# Patient Record
Sex: Male | Born: 1970 | Race: White | Hispanic: No | Marital: Single | Smoking: Never smoker
Health system: Southern US, Community
[De-identification: ages and names within clinical notes are randomized; demographics above are authoritative.]

## PROBLEM LIST (undated history)

## (undated) DIAGNOSIS — T79A29A Traumatic compartment syndrome of unspecified lower extremity, initial encounter: Secondary | ICD-10-CM

## (undated) HISTORY — PX: VASECTOMY: SHX75

## (undated) HISTORY — PX: NO PAST SURGERIES: SHX2092

---

## 1997-12-04 ENCOUNTER — Emergency Department (HOSPITAL_COMMUNITY): Admission: EM | Admit: 1997-12-04 | Discharge: 1997-12-04 | Payer: Self-pay | Admitting: *Deleted

## 1999-09-17 ENCOUNTER — Emergency Department (HOSPITAL_COMMUNITY): Admission: EM | Admit: 1999-09-17 | Discharge: 1999-09-17 | Payer: Self-pay | Admitting: Emergency Medicine

## 1999-09-17 ENCOUNTER — Encounter: Payer: Self-pay | Admitting: Emergency Medicine

## 1999-10-07 ENCOUNTER — Emergency Department (HOSPITAL_COMMUNITY): Admission: EM | Admit: 1999-10-07 | Discharge: 1999-10-08 | Payer: Self-pay | Admitting: Emergency Medicine

## 2000-12-26 ENCOUNTER — Emergency Department (HOSPITAL_COMMUNITY): Admission: EM | Admit: 2000-12-26 | Discharge: 2000-12-26 | Payer: Self-pay | Admitting: *Deleted

## 2001-02-16 ENCOUNTER — Emergency Department (HOSPITAL_COMMUNITY): Admission: EM | Admit: 2001-02-16 | Discharge: 2001-02-16 | Payer: Self-pay | Admitting: Emergency Medicine

## 2005-10-18 ENCOUNTER — Emergency Department (HOSPITAL_COMMUNITY): Admission: EM | Admit: 2005-10-18 | Discharge: 2005-10-18 | Payer: Self-pay | Admitting: Emergency Medicine

## 2005-10-23 ENCOUNTER — Ambulatory Visit (HOSPITAL_BASED_OUTPATIENT_CLINIC_OR_DEPARTMENT_OTHER): Admission: RE | Admit: 2005-10-23 | Discharge: 2005-10-23 | Payer: Self-pay | Admitting: Urology

## 2006-01-05 ENCOUNTER — Emergency Department (HOSPITAL_COMMUNITY): Admission: EM | Admit: 2006-01-05 | Discharge: 2006-01-05 | Payer: Self-pay | Admitting: Emergency Medicine

## 2007-01-26 IMAGING — CR DG ABDOMEN 1V
1 series · 1 of 1 positions shown · non-contrast
Comparison: None.

CLINICAL DATA: Right distal ureteral stone.
 ABDOMEN ? 1 VIEW ? 10/23/05:

[t abdomen supine]
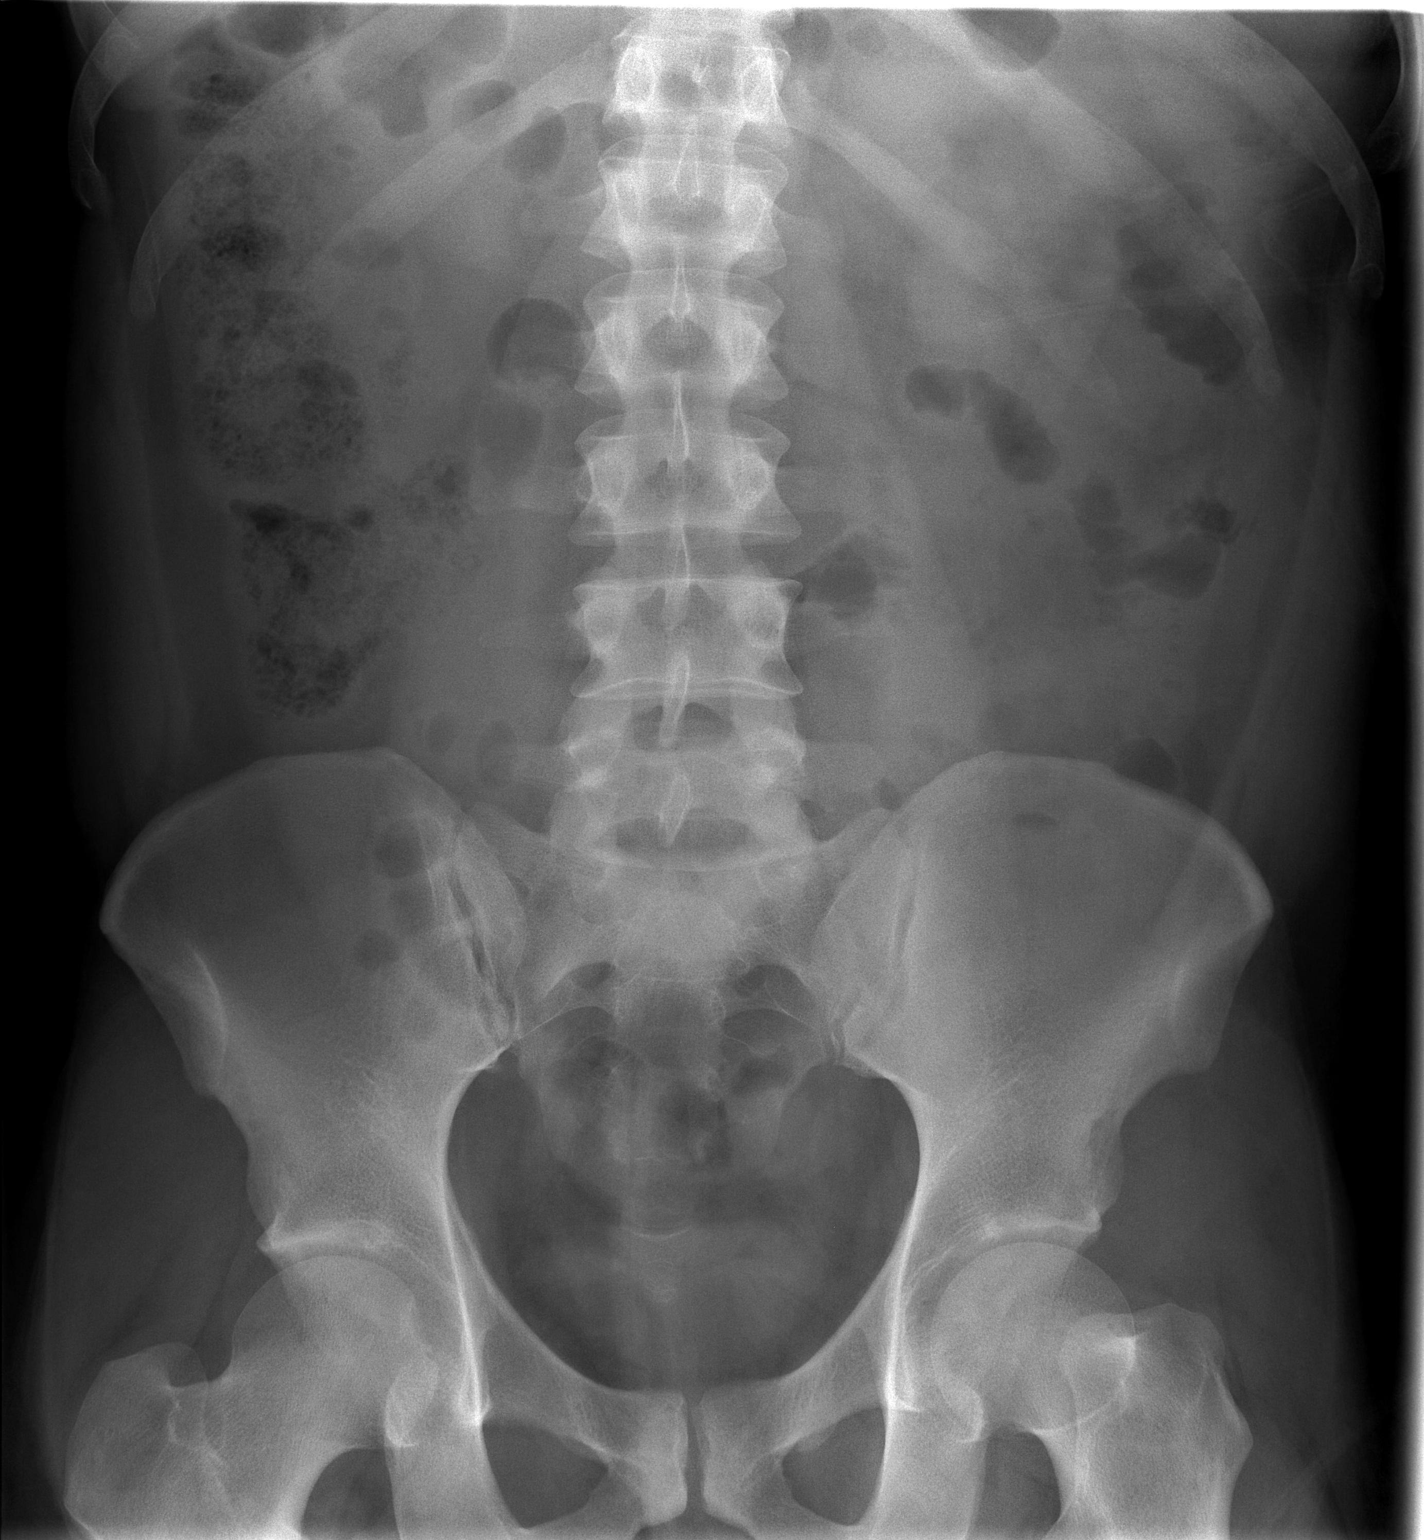

[1 of 1 positions shown; findings below may reference images not displayed]

FINDINGS: The bowel gas pattern is nonspecific.  Bones are unremarkable.  
 A 4-5 mm radiodensity is seen just caudal to the right SI joint which is approximately the location of the ureteral stone seen on the CT scan from 5 days ago.  Although not a definite finding, this may well represent the distal right ureteral stone.
IMPRESSION: 4-5 mm radiopacity just caudal to the right SI joint may represent the distal right ureteral stone.

## 2008-06-04 ENCOUNTER — Encounter: Admission: RE | Admit: 2008-06-04 | Discharge: 2008-06-04 | Payer: Self-pay | Admitting: Internal Medicine

## 2010-11-21 NOTE — Op Note (Signed)
[name], Roger Conway                ACCOUNT NO.:  [id number]   MEDICAL RECORD NO.:  [id number]          PATIENT TYPE:  AMB   LOCATION:  [facility]                         FACILITY:  [facility]   PHYSICIAN:  Bertram Millard. Dahlstedt, M.D.DATE OF BIRTH:  Nov 17, 1970   DATE OF PROCEDURE:  10/23/2005  DATE OF DISCHARGE:                                 OPERATIVE REPORT   PREOPERATIVE DIAGNOSIS:  Right ureteral calculus.   POSTOPERATIVE DIAGNOSES:  Right ureteral calculus.   PROCEDURE:  Right ureteroscopic stone extraction.   SURGEON:  Bertram Millard. Dahlstedt, M.D.   ANESTHESIA:  General.   COMPLICATIONS:  None.   BRIEF HISTORY:  40 year old male who presented earlier this week with a  several day history of right ureteral stone. He has had significant symptoms  and has needed to take narcotics. He has had terrible constipation from the  narcotics. The constipation causes almost as much pain as the stone. The  patient now desires to have treatment of the stone. I feel that ureteroscopy  is the best treatment option in this young gentleman with a distal ureteral  stone. He presents for that procedure.   DESCRIPTION OF PROCEDURE:  The patient was administered preoperative IV  antibiotics and taken to the operating room where general anesthetic was  administered. He was placed in the dorsal lithotomy position. The genitalia  and perineum were prepped and draped. A 6-French panendoscope was advanced  through his urethra and into his bladder. The right ureter was accessed and  the stone was encountered. The basket was used to grasp the stone. It took  several passes of the basket to snare the stone which was then removed.  There was a significant amount of debris behind the stone in that right  ureter. The ureteroscope was then advanced all the way up the ureter as far  as it would go. No other stones were seen. At this point, the scope was  removed, the bladder drained, and the procedure terminated. The  patient was  administered IV Toradol. He tolerated procedure well and was taken to the  PACU in stable condition.      Bertram Millard. Dahlstedt, M.D.  Electronically Signed     SMD/MEDQ  D:  10/23/2005  T:  10/23/2005  Job:  [id number]

## 2012-04-21 ENCOUNTER — Inpatient Hospital Stay (HOSPITAL_COMMUNITY): Payer: 59 | Admitting: Anesthesiology

## 2012-04-21 ENCOUNTER — Encounter (HOSPITAL_COMMUNITY)
Admission: EM | Disposition: A | Discharge status: Home / Self Care | Payer: Self-pay | Source: Ambulatory Visit | Attending: Orthopedic Surgery

## 2012-04-21 ENCOUNTER — Ambulatory Visit (INDEPENDENT_AMBULATORY_CARE_PROVIDER_SITE_OTHER): Payer: 59 | Admitting: Family Medicine

## 2012-04-21 ENCOUNTER — Ambulatory Visit: Payer: 59

## 2012-04-21 ENCOUNTER — Observation Stay (HOSPITAL_COMMUNITY)
Admission: EM | Admit: 2012-04-21 | Discharge: 2012-04-22 | Disposition: A | Discharge status: Home / Self Care | Payer: 59 | Source: Ambulatory Visit | Attending: Orthopedic Surgery | Admitting: Orthopedic Surgery

## 2012-04-21 ENCOUNTER — Encounter (HOSPITAL_COMMUNITY): Payer: Self-pay | Admitting: Anesthesiology

## 2012-04-21 ENCOUNTER — Other Ambulatory Visit: Payer: Self-pay | Admitting: Orthopedic Surgery

## 2012-04-21 ENCOUNTER — Encounter (HOSPITAL_COMMUNITY): Payer: Self-pay | Admitting: *Deleted

## 2012-04-21 ENCOUNTER — Encounter (HOSPITAL_COMMUNITY): Payer: Self-pay | Admitting: Orthopedic Surgery

## 2012-04-21 VITALS — BP 152/90 | HR 70 | Temp 98.0°F | Resp 20 | Ht 65.0 in | Wt 225.0 lb

## 2012-04-21 DIAGNOSIS — M79609 Pain in unspecified limb: Secondary | ICD-10-CM

## 2012-04-21 DIAGNOSIS — M79604 Pain in right leg: Secondary | ICD-10-CM

## 2012-04-21 DIAGNOSIS — Y936A Activity, physical games generally associated with school recess, summer camp and children: Secondary | ICD-10-CM | POA: Insufficient documentation

## 2012-04-21 DIAGNOSIS — Y998 Other external cause status: Secondary | ICD-10-CM | POA: Insufficient documentation

## 2012-04-21 DIAGNOSIS — M25469 Effusion, unspecified knee: Secondary | ICD-10-CM

## 2012-04-21 DIAGNOSIS — M25561 Pain in right knee: Secondary | ICD-10-CM

## 2012-04-21 DIAGNOSIS — M25569 Pain in unspecified knee: Secondary | ICD-10-CM

## 2012-04-21 DIAGNOSIS — T79A29A Traumatic compartment syndrome of unspecified lower extremity, initial encounter: Principal | ICD-10-CM | POA: Diagnosis present

## 2012-04-21 DIAGNOSIS — X58XXXA Exposure to other specified factors, initial encounter: Secondary | ICD-10-CM | POA: Insufficient documentation

## 2012-04-21 DIAGNOSIS — IMO0002 Reserved for concepts with insufficient information to code with codable children: Secondary | ICD-10-CM | POA: Insufficient documentation

## 2012-04-21 DIAGNOSIS — Z01818 Encounter for other preprocedural examination: Secondary | ICD-10-CM

## 2012-04-21 HISTORY — DX: Traumatic compartment syndrome of unspecified lower extremity, initial encounter: T79.A29A

## 2012-04-21 HISTORY — PX: I&D EXTREMITY: SHX5045

## 2012-04-21 LAB — SURGICAL PCR SCREEN: Staphylococcus aureus: NEGATIVE

## 2012-04-21 LAB — BASIC METABOLIC PANEL
BUN: 12 mg/dL (ref 6–23)
Chloride: 104 mEq/L (ref 96–112)
GFR calc Af Amer: >90 mL/min (ref >90)
GFR calc non Af Amer: >90 mL/min (ref >90)
Glucose, Bld: 102 mg/dL — ABNORMAL HIGH (ref 70–99)
Potassium: 3.8 mEq/L (ref 3.5–5.1)
Sodium: 139 mEq/L (ref 135–145)

## 2012-04-21 LAB — POCT UA - MICROSCOPIC ONLY
Bacteria, U Microscopic: NEGATIVE
Casts, Ur, LPF, POC: NEGATIVE
Crystals, Ur, HPF, POC: NEGATIVE
Mucus, UA: POSITIVE
Yeast, UA: NEGATIVE

## 2012-04-21 LAB — POCT URINALYSIS DIPSTICK
Bilirubin, UA: NEGATIVE
Glucose, UA: NEGATIVE
Ketones, UA: NEGATIVE
Leukocytes, UA: NEGATIVE
Nitrite, UA: NEGATIVE
Protein, UA: NEGATIVE
Spec Grav, UA: >=1.03
Urobilinogen, UA: 0.2
pH, UA: 5.5

## 2012-04-21 LAB — CK: Total CK: 231 U/L (ref 7–232)

## 2012-04-21 LAB — CBC
HCT: 38 % — ABNORMAL LOW (ref 39.0–52.0)
Hemoglobin: 13.6 g/dL (ref 13.0–17.0)
RDW: 13.4 % (ref 11.5–15.5)
WBC: 10.1 10*3/uL (ref 4.0–10.5)

## 2012-04-21 SURGERY — IRRIGATION AND DEBRIDEMENT EXTREMITY
Anesthesia: Choice | Site: Leg Upper | Laterality: Right | Wound class: Clean

## 2012-04-21 MED ORDER — DIPHENHYDRAMINE HCL 12.5 MG/5ML PO ELIX: 12.5000 mg | ORAL_SOLUTION | ORAL | Status: DC | PRN

## 2012-04-21 MED ORDER — LACTATED RINGERS IV SOLN
INTRAVENOUS | Status: DC | PRN
  Administered 2012-04-21 (×2): via INTRAVENOUS

## 2012-04-21 MED ORDER — METHOCARBAMOL 500 MG PO TABS: 500.0000 mg | ORAL_TABLET | Freq: Two times a day (BID) | ORAL | Status: DC

## 2012-04-21 MED ORDER — METHOCARBAMOL 500 MG PO TABS
500.0000 mg | ORAL_TABLET | Freq: Four times a day (QID) | ORAL | Status: DC | PRN
  Administered 2012-04-21 – 2012-04-22 (×2): 500 mg via ORAL
  Filled 2012-04-21 (×2): qty 1

## 2012-04-21 MED ORDER — POLYETHYLENE GLYCOL 3350 17 G PO PACK: 17.0000 g | PACK | Freq: Every day | ORAL | Status: DC | PRN

## 2012-04-21 MED ORDER — HYDROMORPHONE HCL PF 1 MG/ML IJ SOLN
INTRAMUSCULAR | Status: DC | PRN
  Administered 2012-04-21 (×2): 0.5 mg via INTRAVENOUS

## 2012-04-21 MED ORDER — DOCUSATE SODIUM 100 MG PO CAPS
100.0000 mg | ORAL_CAPSULE | Freq: Two times a day (BID) | ORAL | Status: DC
  Filled 2012-04-21 (×3): qty 1

## 2012-04-21 MED ORDER — OXYCODONE HCL 5 MG PO TABS: 5.0000 mg | ORAL_TABLET | ORAL | Status: DC | PRN

## 2012-04-21 MED ORDER — HYDROMORPHONE HCL PF 1 MG/ML IJ SOLN
0.5000 mg | INTRAMUSCULAR | Status: DC | PRN
  Administered 2012-04-21: 1 mg via INTRAVENOUS
  Filled 2012-04-21: qty 1

## 2012-04-21 MED ORDER — SENNA 8.6 MG PO TABS
1.0000 | ORAL_TABLET | Freq: Two times a day (BID) | ORAL | Status: DC
  Filled 2012-04-21 (×3): qty 1

## 2012-04-21 MED ORDER — OXYCODONE-ACETAMINOPHEN 5-325 MG PO TABS
1.0000 | ORAL_TABLET | ORAL | Status: DC | PRN
  Administered 2012-04-22 (×2): 2 via ORAL
  Filled 2012-04-21 (×2): qty 2

## 2012-04-21 MED ORDER — CEFAZOLIN SODIUM-DEXTROSE 2-3 GM-% IV SOLR
2.0000 g | INTRAVENOUS | Status: AC
  Administered 2012-04-21: 2 g via INTRAVENOUS
  Filled 2012-04-21: qty 50

## 2012-04-21 MED ORDER — CEFAZOLIN SODIUM 1-5 GM-% IV SOLN
1.0000 g | Freq: Four times a day (QID) | INTRAVENOUS | Status: AC
  Administered 2012-04-21 – 2012-04-22 (×3): 1 g via INTRAVENOUS
  Filled 2012-04-21 (×3): qty 50

## 2012-04-21 MED ORDER — BUPIVACAINE HCL 0.5 % IJ SOLN
INTRAMUSCULAR | Status: DC | PRN
  Administered 2012-04-21: 10 mL

## 2012-04-21 MED ORDER — ONDANSETRON HCL 4 MG PO TABS: 4.0000 mg | ORAL_TABLET | Freq: Four times a day (QID) | ORAL | Status: DC | PRN

## 2012-04-21 MED ORDER — BISACODYL 10 MG RE SUPP: 10.0000 mg | Freq: Every day | RECTAL | Status: DC | PRN

## 2012-04-21 MED ORDER — MUPIROCIN 2 % EX OINT
TOPICAL_OINTMENT | CUTANEOUS | Status: AC
  Administered 2012-04-21: 1
  Filled 2012-04-21: qty 22

## 2012-04-21 MED ORDER — ARTIFICIAL TEARS OP OINT
TOPICAL_OINTMENT | OPHTHALMIC | Status: DC | PRN
  Administered 2012-04-21: 1 via OPHTHALMIC

## 2012-04-21 MED ORDER — EPHEDRINE SULFATE 50 MG/ML IJ SOLN
INTRAMUSCULAR | Status: DC | PRN
  Administered 2012-04-21 (×2): 5 mg via INTRAVENOUS

## 2012-04-21 MED ORDER — ONDANSETRON HCL 4 MG/2ML IJ SOLN: 4.0000 mg | Freq: Four times a day (QID) | INTRAMUSCULAR | Status: DC | PRN

## 2012-04-21 MED ORDER — BUPIVACAINE HCL (PF) 0.5 % IJ SOLN
INTRAMUSCULAR | Status: AC
  Filled 2012-04-21: qty 30

## 2012-04-21 MED ORDER — METHOCARBAMOL 100 MG/ML IJ SOLN
500.0000 mg | Freq: Four times a day (QID) | INTRAVENOUS | Status: DC | PRN
  Filled 2012-04-21: qty 5

## 2012-04-21 MED ORDER — POTASSIUM CHLORIDE IN NACL 20-0.45 MEQ/L-% IV SOLN
INTRAVENOUS | Status: DC
  Administered 2012-04-22: 06:00:00 via INTRAVENOUS
  Filled 2012-04-21 (×3): qty 1000

## 2012-04-21 MED ORDER — MIDAZOLAM HCL 5 MG/5ML IJ SOLN
INTRAMUSCULAR | Status: DC | PRN
  Administered 2012-04-21: 2 mg via INTRAVENOUS

## 2012-04-21 MED ORDER — ZOLPIDEM TARTRATE 5 MG PO TABS: 5.0000 mg | ORAL_TABLET | Freq: Every evening | ORAL | Status: DC | PRN

## 2012-04-21 MED ORDER — PHENYLEPHRINE HCL 10 MG/ML IJ SOLN
INTRAMUSCULAR | Status: DC | PRN
  Administered 2012-04-21: 40 ug via INTRAVENOUS

## 2012-04-21 MED ORDER — METOCLOPRAMIDE HCL 5 MG/ML IJ SOLN: 5.0000 mg | Freq: Three times a day (TID) | INTRAMUSCULAR | Status: DC | PRN

## 2012-04-21 MED ORDER — LIDOCAINE HCL (CARDIAC) 20 MG/ML IV SOLN
INTRAVENOUS | Status: DC | PRN
  Administered 2012-04-21: 50 mg via INTRAVENOUS

## 2012-04-21 MED ORDER — PROPOFOL 10 MG/ML IV BOLUS
INTRAVENOUS | Status: DC | PRN
  Administered 2012-04-21: 200 mg via INTRAVENOUS

## 2012-04-21 MED ORDER — METOCLOPRAMIDE HCL 10 MG PO TABS: 5.0000 mg | ORAL_TABLET | Freq: Three times a day (TID) | ORAL | Status: DC | PRN

## 2012-04-21 MED ORDER — ONDANSETRON HCL 4 MG/2ML IJ SOLN
INTRAMUSCULAR | Status: DC | PRN
  Administered 2012-04-21: 4 mg via INTRAVENOUS

## 2012-04-21 MED ORDER — FENTANYL CITRATE 0.05 MG/ML IJ SOLN
INTRAMUSCULAR | Status: DC | PRN
  Administered 2012-04-21 (×2): 50 ug via INTRAVENOUS
  Administered 2012-04-21: 100 ug via INTRAVENOUS
  Administered 2012-04-21: 50 ug via INTRAVENOUS

## 2012-04-21 MED ORDER — OXYCODONE-ACETAMINOPHEN 10-325 MG PO TABS: 1.0000 | ORAL_TABLET | Freq: Three times a day (TID) | ORAL | Status: DC | PRN

## 2012-04-21 MED ORDER — OXYCODONE-ACETAMINOPHEN 10-325 MG PO TABS: 1.0000 | ORAL_TABLET | Freq: Three times a day (TID) | ORAL | Status: AC | PRN

## 2012-04-21 MED ORDER — SODIUM CHLORIDE 0.9 % IR SOLN
Status: DC | PRN
  Administered 2012-04-21: 3000 mL

## 2012-04-21 SURGICAL SUPPLY — 48 items
BANDAGE ELASTIC 4 VELCRO ST LF (GAUZE/BANDAGES/DRESSINGS) IMPLANT
BANDAGE ELASTIC 6 VELCRO ST LF (GAUZE/BANDAGES/DRESSINGS) ×2 IMPLANT
BANDAGE GAUZE ELAST BULKY 4 IN (GAUZE/BANDAGES/DRESSINGS) IMPLANT
BNDG COHESIVE 4X5 TAN STRL (GAUZE/BANDAGES/DRESSINGS) ×2 IMPLANT
BOOTCOVER CLEANROOM LRG (PROTECTIVE WEAR) IMPLANT
CLOTH BEACON ORANGE TIMEOUT ST (SAFETY) ×2 IMPLANT
COVER SURGICAL LIGHT HANDLE (MISCELLANEOUS) ×2 IMPLANT
CUFF TOURNIQUET SINGLE 34IN LL (TOURNIQUET CUFF) IMPLANT
DRAPE ORTHO SPLIT 77X108 STRL (DRAPES) ×2
DRAPE SURG ORHT 6 SPLT 77X108 (DRAPES) ×2 IMPLANT
DRAPE U-SHAPE 47X51 STRL (DRAPES) ×2 IMPLANT
DRSG MEPILEX BORDER 4X8 (GAUZE/BANDAGES/DRESSINGS) ×4 IMPLANT
DURAPREP 26ML APPLICATOR (WOUND CARE) ×2 IMPLANT
ELECT REM PT RETURN 9FT ADLT (ELECTROSURGICAL) ×2
ELECTRODE REM PT RTRN 9FT ADLT (ELECTROSURGICAL) ×1 IMPLANT
EVACUATOR 1/8 PVC DRAIN (DRAIN) IMPLANT
GAUZE XEROFORM 1X8 LF (GAUZE/BANDAGES/DRESSINGS) IMPLANT
GLOVE BIO SURGEON STRL SZ7 (GLOVE) ×2 IMPLANT
GLOVE BIOGEL PI IND STRL 7.0 (GLOVE) ×1 IMPLANT
GLOVE BIOGEL PI IND STRL 8 (GLOVE) ×1 IMPLANT
GLOVE BIOGEL PI INDICATOR 7.0 (GLOVE) ×1
GLOVE BIOGEL PI INDICATOR 8 (GLOVE) ×1
GLOVE ORTHO TXT STRL SZ7.5 (GLOVE) ×2 IMPLANT
GLOVE SURG ORTHO 8.0 STRL STRW (GLOVE) IMPLANT
GLOVE SURG SS PI 7.0 STRL IVOR (GLOVE) ×2 IMPLANT
GOWN STRL NON-REIN LRG LVL3 (GOWN DISPOSABLE) ×6 IMPLANT
HANDPIECE INTERPULSE COAX TIP (DISPOSABLE) ×2
KIT BASIN OR (CUSTOM PROCEDURE TRAY) ×2 IMPLANT
KIT ROOM TURNOVER OR (KITS) ×2 IMPLANT
MANIFOLD NEPTUNE II (INSTRUMENTS) ×2 IMPLANT
NEEDLE 22X1 1/2 (OR ONLY) (NEEDLE) ×2 IMPLANT
NS IRRIG 1000ML POUR BTL (IV SOLUTION) IMPLANT
PACK ORTHO EXTREMITY (CUSTOM PROCEDURE TRAY) ×2 IMPLANT
PAD ARMBOARD 7.5X6 YLW CONV (MISCELLANEOUS) ×2 IMPLANT
SET HNDPC FAN SPRY TIP SCT (DISPOSABLE) ×1 IMPLANT
SPONGE GAUZE 4X4 12PLY (GAUZE/BANDAGES/DRESSINGS) ×2 IMPLANT
SPONGE LAP 18X18 X RAY DECT (DISPOSABLE) ×2 IMPLANT
STOCKINETTE IMPERVIOUS 9X36 MD (GAUZE/BANDAGES/DRESSINGS) ×2 IMPLANT
SUT ETHILON 2 0 FS 18 (SUTURE) ×4 IMPLANT
SUT ETHILON 3 0 PS 1 (SUTURE) IMPLANT
SYR CONTROL 10ML LL (SYRINGE) ×2 IMPLANT
TOWEL OR 17X24 6PK STRL BLUE (TOWEL DISPOSABLE) ×2 IMPLANT
TOWEL OR 17X26 10 PK STRL BLUE (TOWEL DISPOSABLE) ×2 IMPLANT
TUBE ANAEROBIC SPECIMEN COL (MISCELLANEOUS) IMPLANT
TUBE CONNECTING 12X1/4 (SUCTIONS) ×2 IMPLANT
UNDERPAD 30X30 INCONTINENT (UNDERPADS AND DIAPERS) IMPLANT
WATER STERILE IRR 1000ML POUR (IV SOLUTION) IMPLANT
YANKAUER SUCT BULB TIP NO VENT (SUCTIONS) ×2 IMPLANT

## 2012-04-21 NOTE — H&P (Signed)
PREOPERATIVE H&P  Chief Complaint: Compartment Syndrome Right Thigh  HPI: Roger Conway is a 41 y.o. male who presents for preoperative history and physical with a diagnosis of Compartment Syndrome Right Thigh. Symptoms are rated as moderate to severe, and have been worsening.  This began after he was playing kickball yesterday, and has progressively gotten worse. He was referred to my office from urgent care, with severe thigh pain. He rates the pain as 9/10, and is unable to walk. It's located around the right thigh. He denies any numbness distally.  Past Medical History  Diagnosis Date  . Compartment syndrome of lower extremity 04/21/2012   Past Surgical History  Procedure Date  . Vasectomy   . No past surgeries    History   Social History  . Marital Status: Single    Spouse Name: N/A    Number of Children: N/A  . Years of Education: N/A   Social History Main Topics  . Smoking status: Never Smoker   . Smokeless tobacco: Never Used  . Alcohol Use: 0.6 oz/week    1 Cans of beer per week  . Drug Use: No  . Sexually Active: None   Other Topics Concern  . None   Social History Narrative  . None   Family History  Problem Relation Age of Onset  . Diabetes Mother    No Known Allergies Prior to Admission medications   Medication Sig Start Date End Date Taking? Authorizing Provider  cyclobenzaprine (FLEXERIL) 10 MG tablet Take 10 mg by mouth 3 (three) times daily as needed.    Historical Provider, MD  ibuprofen (ADVIL,MOTRIN) 600 MG tablet Take 600 mg by mouth 4 (four) times daily.    Historical Provider, MD  methocarbamol (ROBAXIN) 500 MG tablet Take 1 tablet (500 mg total) by mouth 2 (two) times daily. 04/21/12   Thao P Le, DO  oxyCODONE-acetaminophen (PERCOCET) 10-325 MG per tablet Take 1 tablet by mouth every 8 (eight) hours as needed for pain. 04/21/12   Thao P Le, DO     Positive ROS: All other systems have been reviewed and were otherwise negative with the  exception of those mentioned in the HPI and as above.  Physical Exam: General: Alert, moderate distress, pale, diaphoretic with pain Cardiovascular: No pedal edema Respiratory: No cyanosis, no use of accessory musculature GI: No organomegaly, abdomen is soft and non-tender Skin: No lesions in the area of chief complaint Neurologic: Sensation intact distally Psychiatric: Patient is competent for consent with normal mood and affect Lymphatic: No axillary or cervical lymphadenopathy  MUSCULOSKELETAL: Right thigh has extreme hardness, anteriorly, less so medially and less so laterally. I cannot get his leg fully straight, and he can not do a straight leg raise, although he can resist gravity. His extensor mechanism seems intact, but is extremely weak. He cannot bend his leg past 80. Range of motion is from about 20 to 80. His pulses are intact that his foot. EHL and FHL are firing.  Assessment: Quadriceps muscle rupture with probable Compartment Syndrome Right Thigh  Plan: Plan for Procedure(s): Right thigh decompressive fasciotomy with evacuation of hematoma   The risks benefits and alternatives were discussed with the patient including but not limited to the risks of nonoperative treatment, versus surgical intervention including infection, bleeding, nerve injury,  blood clots, cardiopulmonary complications, morbidity, mortality, among others, and they were willing to proceed. we've also discussed the possibility of the need for staged closure, and the possibility of placement of a wound  VAC. We will base this upon the degree of necrosis and how tight the wound is after decompressive evacuation.   Shernell Saldierna P, MD Cell [phone] Pager [phone]  04/21/2012 7:07 PM

## 2012-04-21 NOTE — Anesthesia Postprocedure Evaluation (Signed)
  Anesthesia Post-op Note  Patient: Roger Conway  Procedure(s) Performed: Procedure(s) (LRB) with comments: IRRIGATION AND DEBRIDEMENT EXTREMITY (Right) - Right Thigh Fasciotomy and evacuation  Patient Location: PACU  Anesthesia Type: General  Level of Consciousness: awake, alert  and oriented  Airway and Oxygen Therapy: Patient Spontanous Breathing and Patient connected to face mask oxygen  Post-op Pain: none  Post-op Assessment: Post-op Vital signs reviewed  Post-op Vital Signs: Reviewed  Complications: No apparent anesthesia complications

## 2012-04-21 NOTE — Anesthesia Preprocedure Evaluation (Addendum)
Anesthesia Evaluation  Patient identified by MRN, date of birth, ID band Patient awake    Reviewed: Allergy & Precautions  Airway Mallampati: II TM Distance: >3 FB Neck ROM: Full  Mouth opening: Limited Mouth Opening  Dental  (+) Teeth Intact and Dental Advisory Given   Pulmonary          Cardiovascular Rhythm:Regular     Neuro/Psych    GI/Hepatic   Endo/Other  Morbid obesity  Renal/GU      Musculoskeletal   Abdominal   Peds  Hematology   Anesthesia Other Findings   Reproductive/Obstetrics                          Anesthesia Physical Anesthesia Plan  ASA: III  Anesthesia Plan: General   Post-op Pain Management:    Induction: Intravenous  Airway Management Planned: LMA  Additional Equipment:   Intra-op Plan:   Post-operative Plan: Extubation in OR  Informed Consent: I have reviewed the patients History and Physical, chart, labs and discussed the procedure including the risks, benefits and alternatives for the proposed anesthesia with the patient or authorized representative who has indicated his/her understanding and acceptance.   Dental advisory given  Plan Discussed with: CRNA and Anesthesiologist  Anesthesia Plan Comments:         Anesthesia Quick Evaluation

## 2012-04-21 NOTE — Progress Notes (Signed)
[facility]:  Office Visit  Chief Complaint:  Chief Complaint  Patient presents with  . right thigh strain    happened last night    HPI: Roger Conway is a 41 y.o. male who complains of right thigh pain for the last 6 weeks but last night the sxs got worse. He had a knot in the leg and has had leg spasms which started 6 weeks ago after playing kickball and has been weightbearing and doing normal acitivities with it up until last night.. Was able to play kickball last night but when got home had increase pain a]nd swelling. No risk factors for PE. Denies SOB/CP. 9/10 radiating pain from mid anterior thigh to anterior knee. Cannot extend. Tried ice and flexeril without relief.  History reviewed. No pertinent past medical history. Past Surgical History  Procedure Date  . Vasectomy    History   Social History  . Marital Status: Single    Spouse Name: N/A    Number of Children: N/A  . Years of Education: N/A   Social History Main Topics  . Smoking status: Never Smoker   . Smokeless tobacco: None  . Alcohol Use: None  . Drug Use: None  . Sexually Active: None   Other Topics Concern  . None   Social History Narrative  . None   Family History  Problem Relation Age of Onset  . Diabetes Mother    No Known Allergies Prior to Admission medications   Medication Sig Start Date End Date Taking? Authorizing Provider  cyclobenzaprine (FLEXERIL) 10 MG tablet Take 10 mg by mouth 3 (three) times daily as needed.   Yes Historical Provider, MD  ibuprofen (ADVIL,MOTRIN) 600 MG tablet Take 600 mg by mouth 4 (four) times daily.   Yes Historical Provider, MD     ROS: The patient denies fevers, chills, night sweats, unintentional weight loss, chest pain, palpitations, wheezing, dyspnea on exertion, nausea, vomiting, abdominal pain, dysuria, hematuria, melena, numbness, weakness, or tingling.   All other systems have been reviewed and were otherwise negative with the  exception of those mentioned in the HPI and as above.    PHYSICAL EXAM: Filed Vitals:   04/21/12 1312  BP: 152/90  Pulse: 70  Temp: 98 F (36.7 C)  Resp: 20   Filed Vitals:   04/21/12 1312  Height: 5\' 5"  (1.651 m)  Weight: 225 lb (102.059 kg)   Body mass index is 37.44 kg/(m^2).  General: Alert, no acute distress HEENT:  Normocephalic, atraumatic, oropharynx patent.  Cardiovascular:  Regular rate and rhythm, no rubs murmurs or gallops.  No Carotid bruits, radial pulse intact. No pedal edema.  Respiratory: Clear to auscultation bilaterally.  No wheezes, rales, or rhonchi.  No cyanosis, no use of accessory musculature GI: No organomegaly, abdomen is soft and non-tender, positive bowel sounds.  No masses. Skin: No rashes. Neurologic: Facial musculature symmetric. Psychiatric: Patient is appropriate throughout our interaction. Lymphatic: No cervical lymphadenopathy Musculoskeletal: Gait intact. Right leg-swollen thigh, in flexion, + knee effusion, tender to palpation, + unable to actively or passively extend leg + DP + medial knee tenderness at MCL     LABS: No results found for this or any previous visit.   EKG/XRAY:   Primary read interpreted by Dr. Conley Rolls at [facility]. No obvious fracture or subluxation of femur or knee Soft tissue edema on right anterior knee , ? Abnormal appearance of distal quadriceps msk   ASSESSMENT/PLAN: Encounter Diagnoses  Name Primary?  Marland Kitchen  Right knee pain Yes  . Right leg pain   . Knee swelling    Refer to Delbert Harness for ? Knee effusion with inability to extend or do straight leg raise with a lot of pressure in thigh. Was able to d/w ortho and recommended patient be seen today in office ? Quadriceps tear vs  Increase compartmental syndrome in anterior thigh ? hematoma Will get CK and UA with micro for ruleout rhabdo Patient given paper rx for oxycodone/acetaminophen and robaxin    Athalene Kolle PHUONG, DO 04/21/2012 3:18 PM

## 2012-04-21 NOTE — Op Note (Signed)
04/21/2012  8:02 PM  PATIENT:  Roger Conway    PRE-OPERATIVE DIAGNOSIS:  Compartment Syndrome Right Thigh  POST-OPERATIVE DIAGNOSIS:  Same  PROCEDURE:  Right thigh decompressive fasciotomy of the anterior compartment with evacuation of hematoma  SURGEON:  Eulas Post, MD  PHYSICIAN ASSISTANT: None  ANESTHESIA:   General  PREOPERATIVE INDICATIONS:  Roger Conway is a  41 y.o. male with a diagnosis of Compartment Syndrome Right Thigh who  elected for surgical management.  This began after playing kickball yesterday, and he tore his quadricep muscle, and had progressive severe increasing pain and tightness in the thigh.  The risks benefits and alternatives were discussed with the patient preoperatively including but not limited to the risks of infection, bleeding, nerve injury, cardiopulmonary complications, the need for revision surgery, among others, and the patient was willing to proceed.  OPERATIVE IMPLANTS: None  OPERATIVE FINDINGS: Large hematoma with quadricep muscle injury to the thigh  OPERATIVE PROCEDURE: The patient is brought to the operating room and placed in supine position. General anesthesia was administered. Time out was performed. IV antibiotics were given. The right lower extremity was prepped and draped in usual sterile fashion. Incision was made over the anterior thigh, and the quadricep muscle exposed. There was hemorrhage, with a fairly large hematoma, that was tight, and this was evacuated completely. An anterior fasciotomy was performed. Once the hematoma had been evacuated, the compartments were soft, and the wounds were irrigated copiously, and the rectus femoris was the primary muscle and involved, but there was no complete necrosis, but certainly damaged muscle. The wound was irrigated with pulse lavage.. The wound was closed with nylon sutures for the skin. The incision length was approximately 10 cm. Sterile gauze followed by a light compressive wrap was  applied. The patient was awakened and returned to the PACU in stable and satisfactory condition. He'll be admitted overnight for observation. There were no complications.

## 2012-04-21 NOTE — Transfer of Care (Signed)
Immediate Anesthesia Transfer of Care Note  Patient: Roger Conway  Procedure(s) Performed: Procedure(s) (LRB) with comments: IRRIGATION AND DEBRIDEMENT EXTREMITY (Right) - Right Thigh Fasciotomy and evacuation  Patient Location: PACU  Anesthesia Type: General  Level of Consciousness: sedated  Airway & Oxygen Therapy: Patient Spontanous Breathing and Patient connected to face mask oxygen  Post-op Assessment: Report given to PACU RN and Post -op Vital signs reviewed and stable  Post vital signs: Reviewed and stable  Complications: No apparent anesthesia complications

## 2012-04-22 ENCOUNTER — Encounter (HOSPITAL_COMMUNITY): Payer: Self-pay | Admitting: Orthopedic Surgery

## 2012-04-22 NOTE — Progress Notes (Signed)
Orthopedic Tech Progress Note Patient Details:  Roger Conway 09-Oct-1970 [id number] Crutches ordered for patient and delivered. Patient stated that he had been using crutches with PT and felt comfortable with them. Crutches adjusted to height that patient used with PT and left in the room for patient use. Ortho Devices Type of Ortho Device: Crutches Ortho Device/Splint Interventions: Ordered   [place] R Thompson 04/22/2012, 9:57 AM

## 2012-04-22 NOTE — Progress Notes (Signed)
Pt had minimal during the night, mostly c/o soreness and slight incisional pain. Rested well. Assisted OOB with crutches, needed standby assist only. Assisted to the chair this AM, tolerated very well.

## 2012-04-22 NOTE — Addendum Note (Signed)
Addendum  created 04/22/12 1424 by Lakesha Levinson R Tere Mcconaughey, CRNA   Modules edited:Anesthesia Medication Administration    

## 2012-04-22 NOTE — Progress Notes (Signed)
Utilization review completed. Koden Hunzeker, RN, BSN. 

## 2012-04-22 NOTE — Discharge Summary (Signed)
Physician Discharge Summary  Patient ID: Roger Conway MRN: [id number] DOB/AGE: [birth year] 39 y.o.  Admit date: 04/21/2012 Discharge date: 04/22/2012  Admission Diagnoses:  Compartment syndrome of lower extremity  Discharge Diagnoses:  Principal Problem:  *Compartment syndrome of lower extremity, right thigh   Past Medical History  Diagnosis Date  . Compartment syndrome of lower extremity 04/21/2012    Surgeries: Procedure(s): Right thigh fasciotomy with evacuation of hematoma on 04/21/2012   Consultants (if any):    Discharged Condition: Improved  Hospital Course: EDWARD GUTHMILLER is an 41 y.o. male who was admitted 04/21/2012 with a diagnosis of Compartment syndrome of lower extremity and went to the operating room on 04/21/2012 and underwent the above named procedures.  This happened after a game of kickball, and he tore his rectus femoris, and bled into his thigh.  He was given perioperative antibiotics:  Anti-infectives     Start     Dose/Rate Route Frequency Ordered Stop   04/22/12 0000   ceFAZolin (ANCEF) IVPB 1 g/50 mL premix        1 g 100 mL/hr over 30 Minutes Intravenous Every 6 hours 04/21/12 2135 04/22/12 1759   04/21/12 1809   ceFAZolin (ANCEF) IVPB 2 g/50 mL premix        2 g 100 mL/hr over 30 Minutes Intravenous 60 min pre-op 04/21/12 1809 04/21/12 1918        .  He was given sequential compression devices, early ambulation, and sequential compression devices for DVT prophylaxis.  He benefited maximally from the hospital stay and there were no complications.    Recent vital signs:  Filed Vitals:   04/22/12 0600  BP: 106/59  Pulse: 79  Temp: 97.8 F (36.6 C)  Resp: 18    Recent laboratory studies:  Lab Results  Component Value Date   HGB 13.6 04/21/2012   Lab Results  Component Value Date   WBC 10.1 04/21/2012   PLT 211 04/21/2012   No results found for this basename: INR   Lab Results  Component Value Date   NA 139 04/21/2012     K 3.8 04/21/2012   CL 104 04/21/2012   CO2 25 04/21/2012   BUN 12 04/21/2012   CREATININE 0.96 04/21/2012   GLUCOSE 102* 04/21/2012    Discharge Medications:     Medication List     As of 04/22/2012  7:42 AM    TAKE these medications         aspirin 325 MG EC tablet   Take 650 mg by mouth. For pain      cyclobenzaprine 10 MG tablet   Commonly known as: FLEXERIL   Take 10 mg by mouth 3 (three) times daily as needed. For muscle spasms      ibuprofen 600 MG tablet   Commonly known as: ADVIL,MOTRIN   Take 600 mg by mouth every 6 (six) hours as needed. For pain      ICY HOT EX   Apply 1 application topically as needed. For muscle pain      oxyCODONE-acetaminophen 10-325 MG per tablet   Commonly known as: PERCOCET   Take 1-2 tablets by mouth every 8 (eight) hours as needed for pain.        Diagnostic Studies: Dg Femur Right  04/21/2012  *RADIOLOGY REPORT*  Clinical Data: Right thigh swelling and pain after sports.  RIGHT FEMUR - 2 VIEW  Comparison: Contemporaneous knee  Findings: No fracture or dislocation.  The femoral head remains seated within  the acetabulum.  No aggressive osseous lesion.  IMPRESSION: No acute osseous abnormality of the right femur.   Original Report Authenticated By: Waneta Martins, M.D.    Dg Knee Complete 4 Views Right  04/21/2012  *RADIOLOGY REPORT*  Clinical Data: Right knee pain, swelling.  RIGHT KNEE - COMPLETE 4+ VIEW  Comparison: And is by radiograph  Findings: There is significant soft tissue swelling superior to the patella. This may reflect a hematoma and an underlying quadriceps tendon injury is not excluded.  No significant retraction of the patella.  No displaced fracture or dislocation.  No aggressive osseous lesion.  IMPRESSION: Soft tissue swelling superior to the patella may reflect a hematoma.  Underlying quadriceps tendon injury not excluded in the appropriate clinical setting.  Correlate with physical examination.  Clinically  significant discrepancy from primary report, if provided: None   Original Report Authenticated By: Waneta Martins, M.D.     Disposition: Final discharge disposition not confirmed      Discharge Orders    Future Orders Please Complete By Expires   Diet general      Call MD / Call 911      Comments:   If you experience chest pain or shortness of breath, CALL 911 and be transported to the hospital emergency room.  If you develope a fever above 101 F, pus (white drainage) or increased drainage or redness at the wound, or calf pain, call your surgeon's office.   Discharge instructions      Comments:   Change dressing in 3 days and reapply fresh dressing, unless you have a splint (half cast).  If you have a splint/cast, just leave in place until your follow-up appointment.    Keep wounds dry for 3 weeks.  Leave steri-strips in place on skin.  Do not apply lotion or anything to the wound.   Constipation Prevention      Comments:   Drink plenty of fluids.  Prune juice may be helpful.  You may use a stool softener, such as Colace (over the counter) 100 mg twice a day.  Use MiraLax (over the counter) for constipation as needed.   Weight bearing as tolerated      Increase activity slowly as tolerated         Follow-up Information    Follow up with Eulas Post, MD. Schedule an appointment as soon as possible for a visit in 1 week.   Contact information:   [address] [place] [address] [phone]           Signed: Eulas Post 04/22/2012, 7:42 AM

## 2012-04-22 NOTE — Addendum Note (Signed)
Addendum  created 04/22/12 1424 by Luster Landsberg, CRNA   Modules edited:Anesthesia Medication Administration

## 2012-04-22 NOTE — Evaluation (Signed)
Physical Therapy Evaluation Patient Details Name: Roger Conway MRN: [id number] DOB: 21-Aug-1970 Today's Date: 04/22/2012 Time: [id number] PT Time Calculation (min): 19 min  PT Assessment / Plan / Recommendation Clinical Impression  Pt s/p I&D of right rectus femoris. Pt with good functional mobility and safety using crutches for all mobility. Pt is supervision level for all activity, no further PT needs. All education complete.     PT Assessment  Patent does not need any further PT services    Follow Up Recommendations  No PT follow up;Supervision for mobility/OOB    Does the patient have the potential to tolerate intense rehabilitation      Barriers to Discharge        Equipment Recommendations  Other (comment) (crutches)    Recommendations for Other Services     Frequency      Precautions / Restrictions Precautions Precautions: None Restrictions Weight Bearing Restrictions: Yes RLE Weight Bearing: Weight bearing as tolerated   Pertinent Vitals/Pain Pt with minimal pain complaints, no number given.       Mobility  Bed Mobility Bed Mobility: Supine to Sit;Sitting - Scoot to Edge of Bed;Sit to Supine Supine to Sit: 6: Modified independent (Device/Increase time) Sitting - Scoot to Edge of Bed: 6: Modified independent (Device/Increase time) Sit to Supine: 6: Modified independent (Device/Increase time) Transfers Transfers: Sit to Stand;Stand to Sit Sit to Stand: 5: Supervision;With upper extremity assist;From bed Stand to Sit: 5: Supervision;With upper extremity assist;To bed Details for Transfer Assistance: Cues for safe technique standing to/from bed to crutches Ambulation/Gait Ambulation/Gait Assistance: 5: Supervision Ambulation Distance (Feet): 200 Feet Assistive device: Crutches Ambulation/Gait Assistance Details: VC throughout for proper sequencing and safety using crutches. Pt is WBAT although ambulated majority of distance without putting R foot down Gait  Pattern: Step-to pattern;Decreased step length - left Gait velocity: decreased gait speed Stairs: Yes Stairs Assistance: 5: Supervision Stairs Assistance Details (indicate cue type and reason): VC for proper sequencing with crutches in one hand and using railing on other.  Stair Management Technique: One rail Right;With crutches;Step to pattern;Forwards Number of Stairs: 3       Visit Information  Last PT Received On: 04/22/12 Assistance Needed: +1    Subjective Data  Patient Stated Goal: to go home   Prior Functioning  Home Living Lives With: Spouse Available Help at Discharge: Family;Available 24 hours/day Type of Home: Apartment Home Access: Stairs to enter Entrance Stairs-Number of Steps: 36 (three floors) Entrance Stairs-Rails: Right;Left Home Layout: One level Bathroom Shower/Tub: Forensic scientist: Standard Bathroom Accessibility: Yes How Accessible: Accessible via walker Prior Function Level of Independence: Independent Able to Take Stairs?: Yes Driving: Yes Vocation: Full time employment Communication Communication: No difficulties Dominant Hand: Right    Cognition  Overall Cognitive Status: Appears within functional limits for tasks assessed/performed Arousal/Alertness: Awake/alert Orientation Level: Appears intact for tasks assessed Behavior During Session: [facility] for tasks performed    Extremity/Trunk Assessment Right Lower Extremity Assessment RLE ROM/Strength/Tone: Unable to fully assess;Due to pain RLE Sensation: WFL - Light Touch Left Lower Extremity Assessment LLE ROM/Strength/Tone: Within functional levels LLE Sensation: WFL - Light Touch   Balance    End of Session PT - End of Session Equipment Utilized During Treatment: Gait belt Activity Tolerance: Patient tolerated treatment well Patient left: in bed;with call bell/phone within reach;with family/visitor present Nurse Communication: Mobility status  GP Functional  Assessment Tool Used: clinical judgement Functional Limitation: Mobility: Walking and moving around Mobility: Walking and Moving Around Current Status ([id number]):  At least 1 percent but less than 20 percent impaired, limited or restricted Mobility: Walking and Moving Around Goal Status [phone]): At least 1 percent but less than 20 percent impaired, limited or restricted Mobility: Walking and Moving Around Discharge Status [phone]): At least 1 percent but less than 20 percent impaired, limited or restricted   Milana Kidney 04/22/2012, 10:05 AM  04/22/2012 Milana Kidney DPT PAGER: [phone] OFFICE: [phone]

## 2012-08-20 ENCOUNTER — Other Ambulatory Visit: Payer: Self-pay

## 2013-05-11 ENCOUNTER — Other Ambulatory Visit: Payer: Self-pay

## 2013-07-25 IMAGING — CR DG FEMUR 2+V*R*
3 series · 3 of 3 positions shown · non-contrast
Comparison: Contemporaneous knee

CLINICAL DATA: Right thigh swelling and pain after sports.

RIGHT FEMUR - 2 VIEW

[AP (1 of 2)]
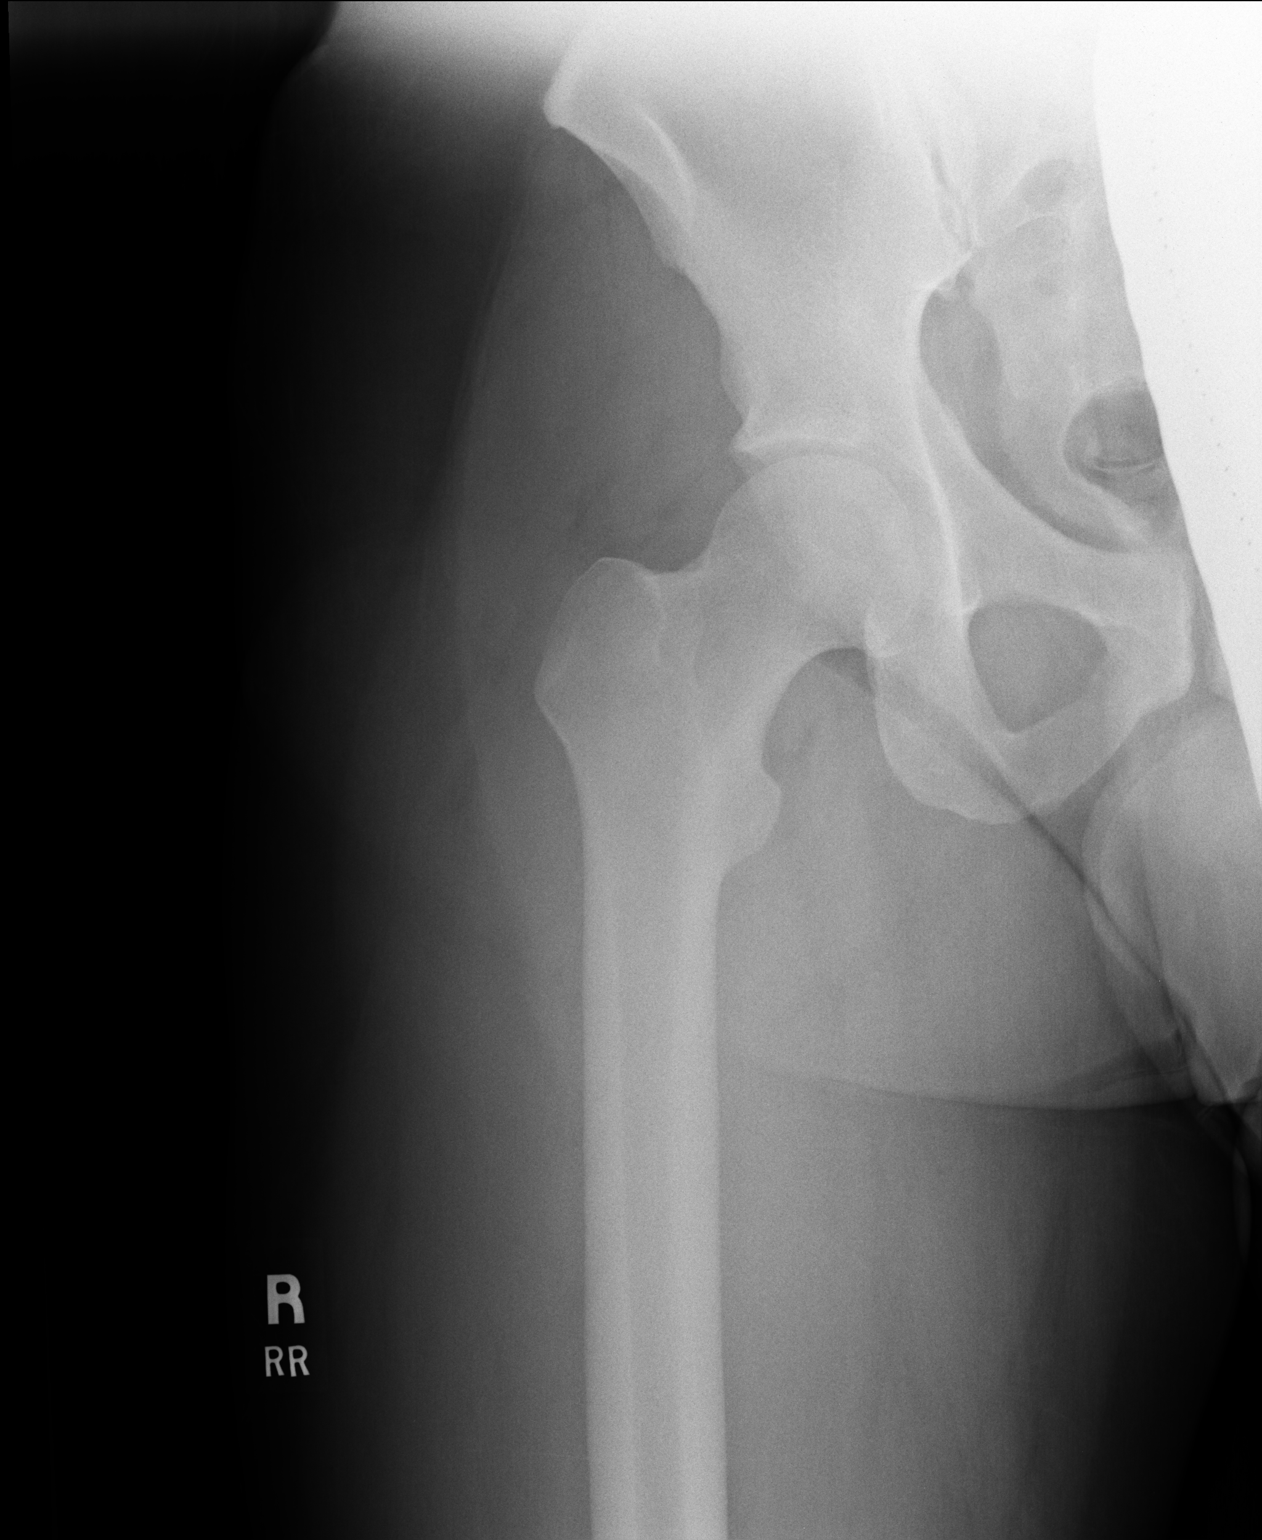

[lateral]
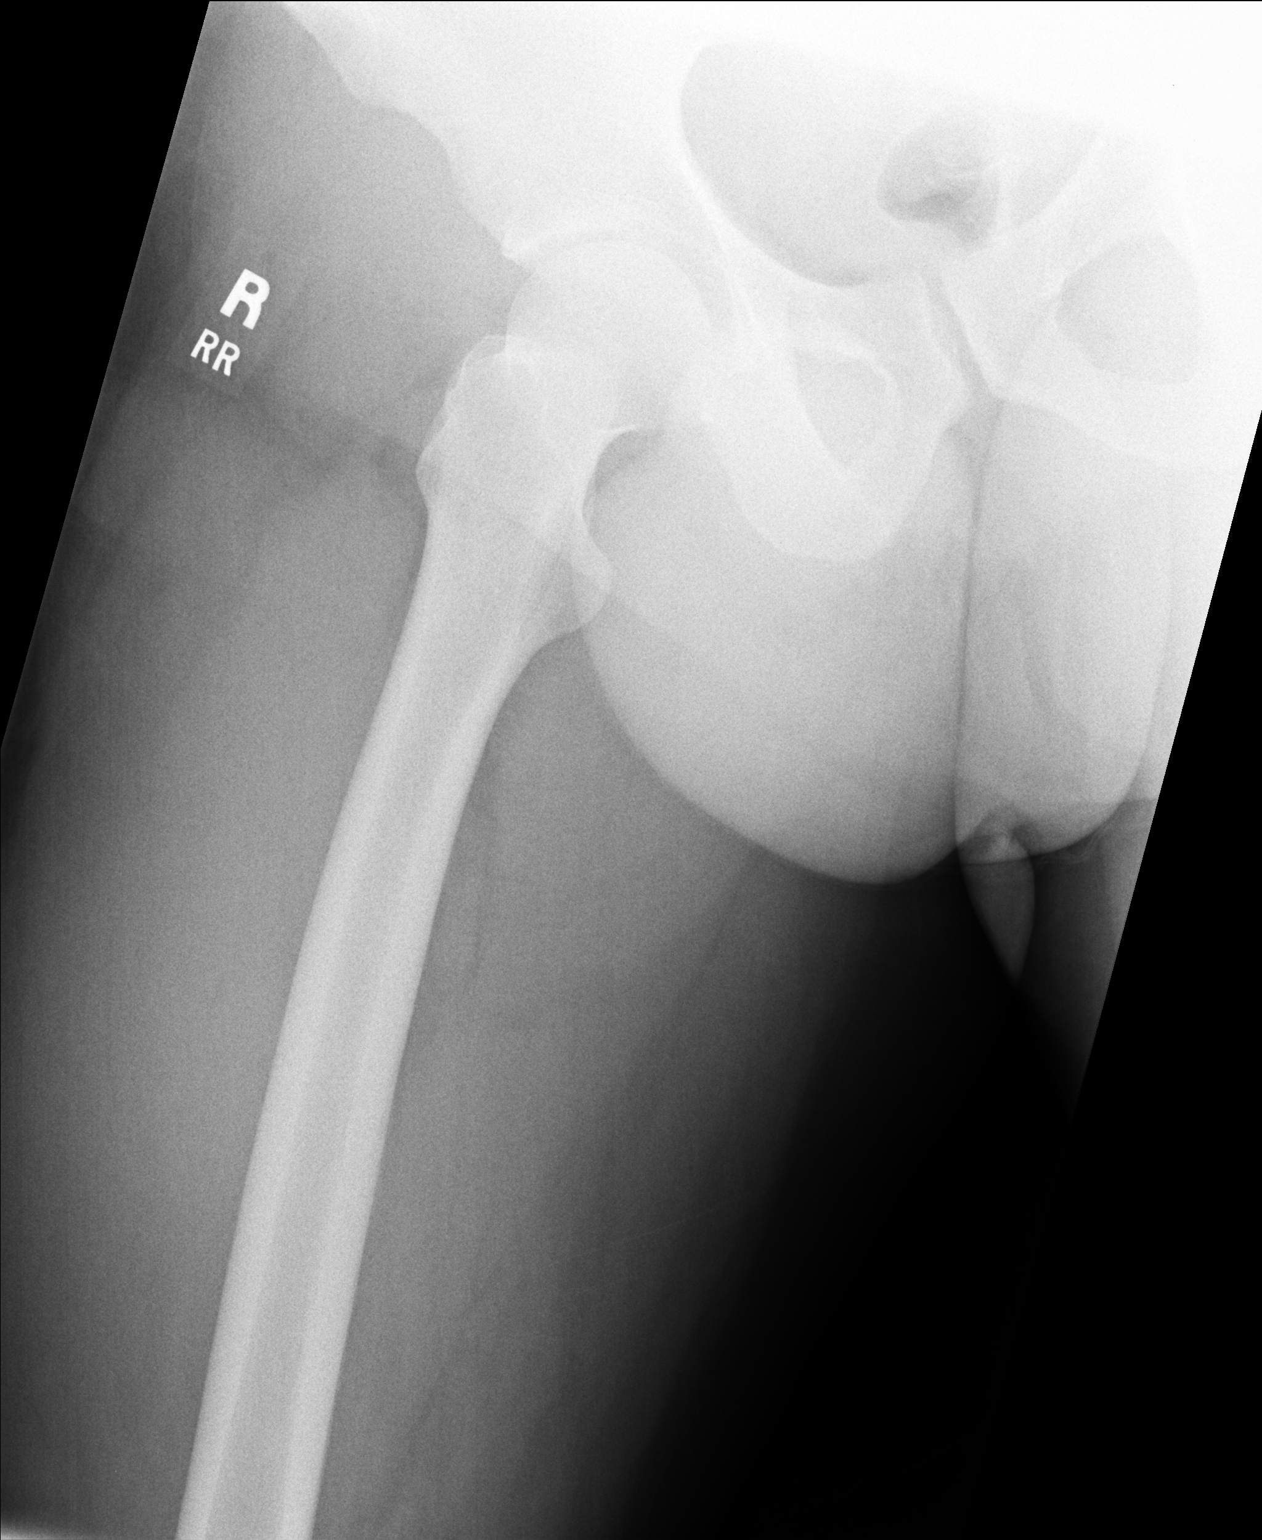

[AP (2 of 2)]
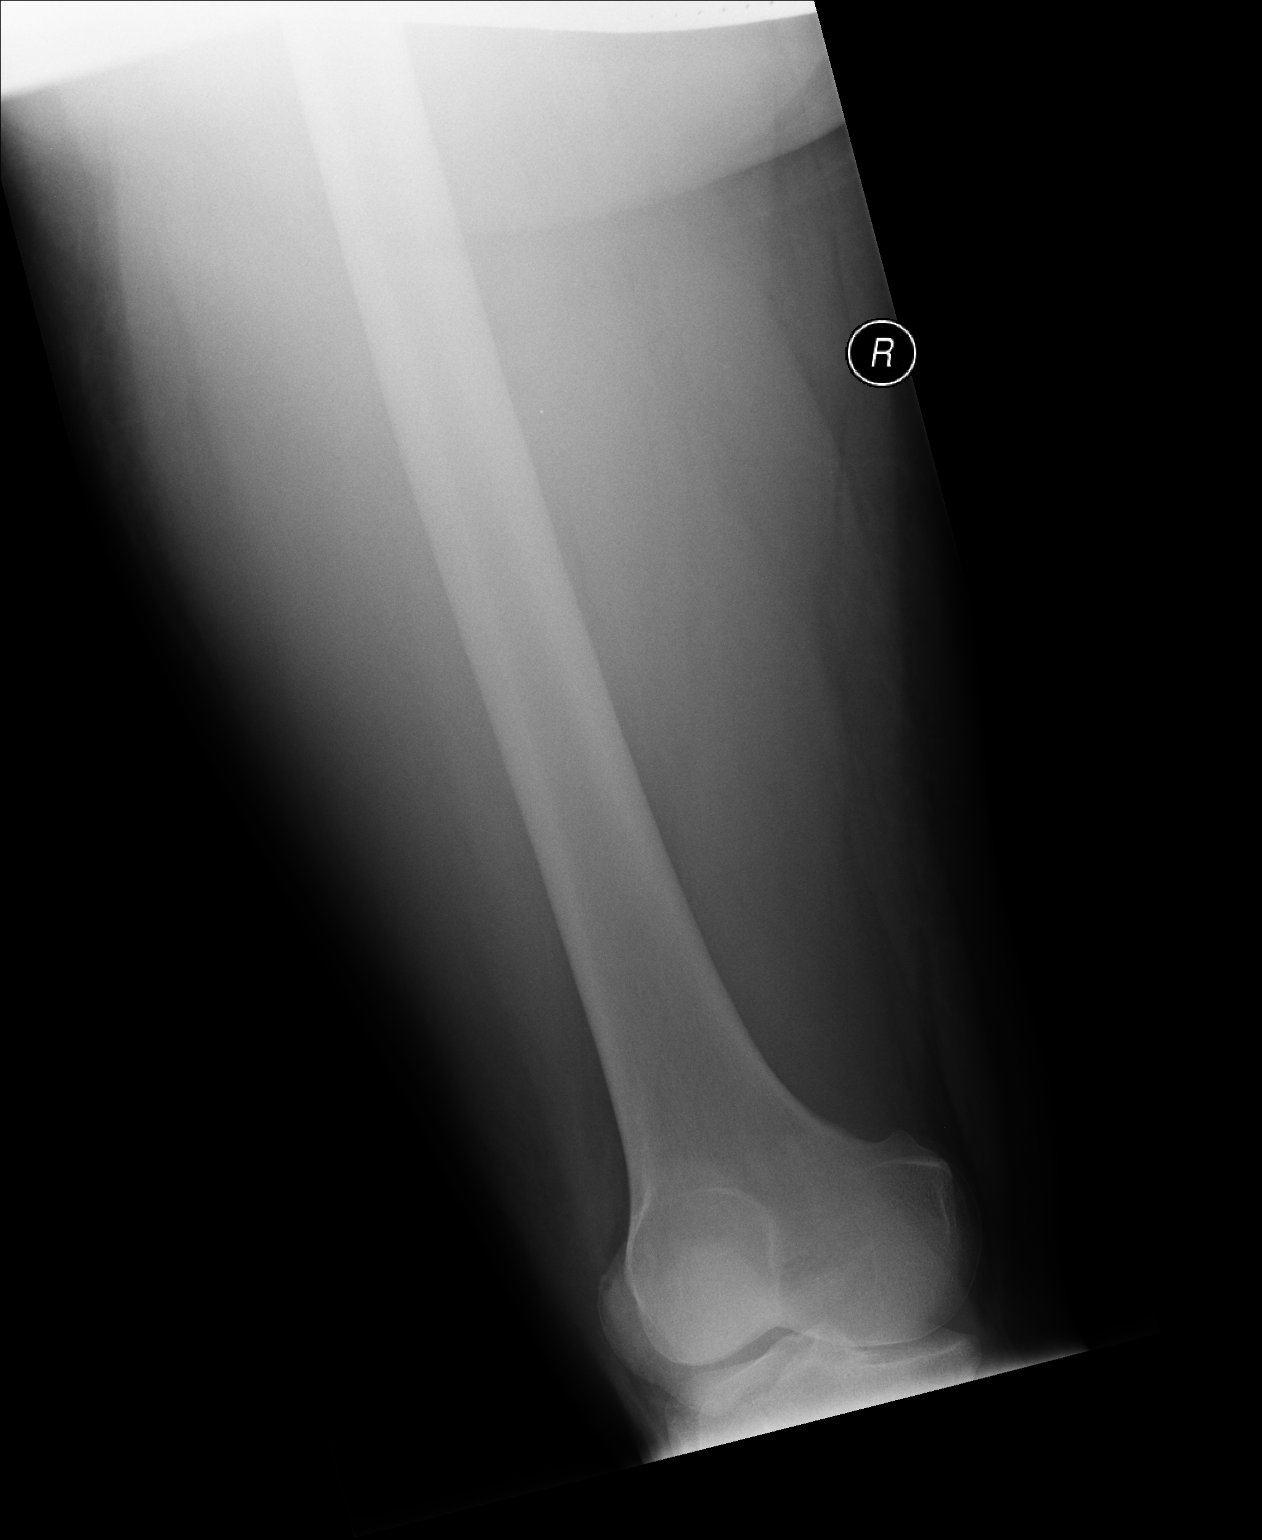

[3 of 3 positions shown; findings below may reference images not displayed]

FINDINGS: No fracture or dislocation.  The femoral head remains
seated within the acetabulum.  No aggressive osseous lesion.
IMPRESSION: No acute osseous abnormality of the right femur.

## 2020-10-11 ENCOUNTER — Telehealth (HOSPITAL_COMMUNITY): Payer: Self-pay

## 2020-10-11 NOTE — Telephone Encounter (Signed)
Called and spoke with pt in regards to CR, pt stated he is not interested at this time due to his work schedule.  Closed referral
